# Patient Record
Sex: Female | Born: 1990 | Race: Black or African American | Hispanic: No | Marital: Single | Smoking: Never smoker
Health system: Southern US, Community
[De-identification: ages and names within clinical notes are randomized; demographics above are authoritative.]

## PROBLEM LIST (undated history)

## (undated) DIAGNOSIS — A6000 Herpesviral infection of urogenital system, unspecified: Secondary | ICD-10-CM

---
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
## 2011-05-25 ENCOUNTER — Emergency Department (HOSPITAL_COMMUNITY)
Admission: EM | Admit: 2011-05-25 | Discharge: 2011-05-25 | Disposition: A | Discharge status: Home / Self Care | Payer: No Typology Code available for payment source | Attending: Emergency Medicine | Admitting: Emergency Medicine

## 2011-05-25 ENCOUNTER — Encounter (HOSPITAL_COMMUNITY): Payer: Self-pay | Admitting: General Practice

## 2011-05-25 DIAGNOSIS — M545 Low back pain, unspecified: Secondary | ICD-10-CM | POA: Insufficient documentation

## 2011-05-25 DIAGNOSIS — X503XXA Overexertion from repetitive movements, initial encounter: Secondary | ICD-10-CM | POA: Insufficient documentation

## 2011-05-25 DIAGNOSIS — R10819 Abdominal tenderness, unspecified site: Secondary | ICD-10-CM | POA: Insufficient documentation

## 2011-05-25 DIAGNOSIS — R110 Nausea: Secondary | ICD-10-CM | POA: Insufficient documentation

## 2011-05-25 DIAGNOSIS — R109 Unspecified abdominal pain: Secondary | ICD-10-CM | POA: Insufficient documentation

## 2011-05-25 DIAGNOSIS — S39011A Strain of muscle, fascia and tendon of abdomen, initial encounter: Secondary | ICD-10-CM

## 2011-05-25 DIAGNOSIS — IMO0002 Reserved for concepts with insufficient information to code with codable children: Secondary | ICD-10-CM | POA: Insufficient documentation

## 2011-05-25 LAB — URINALYSIS, ROUTINE W REFLEX MICROSCOPIC
Bilirubin Urine: NEGATIVE
Glucose, UA: NEGATIVE mg/dL
Ketones, ur: NEGATIVE mg/dL
Leukocytes, UA: NEGATIVE
Nitrite: NEGATIVE
Protein, ur: NEGATIVE mg/dL
Specific Gravity, Urine: 1.022 (ref 1.005–1.030)
Urobilinogen, UA: 0.2 mg/dL (ref 0.0–1.0)
pH: 7.5 (ref 5.0–8.0)

## 2011-05-25 LAB — URINE MICROSCOPIC-ADD ON

## 2011-05-25 LAB — PREGNANCY, URINE: Preg Test, Ur: NEGATIVE

## 2011-05-25 MED ORDER — KETOROLAC TROMETHAMINE 30 MG/ML IJ SOLN
30.0000 mg | Freq: Once | INTRAMUSCULAR | Status: AC
  Administered 2011-05-25: 30 mg via INTRAVENOUS
  Filled 2011-05-25: qty 1

## 2011-05-25 MED ORDER — SODIUM CHLORIDE 0.9 % IV BOLUS (SEPSIS)
1000.0000 mL | Freq: Once | INTRAVENOUS | Status: AC
  Administered 2011-05-25: 1000 mL via INTRAVENOUS

## 2011-05-25 MED ORDER — HYDROCODONE-ACETAMINOPHEN 5-500 MG PO TABS: 1.0000 | ORAL_TABLET | Freq: Four times a day (QID) | ORAL | Status: AC | PRN

## 2011-05-25 MED ORDER — MORPHINE SULFATE 4 MG/ML IJ SOLN
4.0000 mg | Freq: Once | INTRAMUSCULAR | Status: AC
  Administered 2011-05-25: 4 mg via INTRAVENOUS
  Filled 2011-05-25: qty 1

## 2011-05-25 MED ORDER — NAPROXEN 500 MG PO TABS: 500.0000 mg | ORAL_TABLET | Freq: Two times a day (BID) | ORAL | Status: DC

## 2011-05-25 MED ORDER — ONDANSETRON HCL 4 MG/2ML IJ SOLN
4.0000 mg | Freq: Once | INTRAMUSCULAR | Status: AC
Start: 2011-05-25 — End: 2011-05-25
  Administered 2011-05-25: 4 mg via INTRAVENOUS
  Filled 2011-05-25: qty 2

## 2011-05-25 NOTE — ED Notes (Signed)
Patient states that she was doing some strength training exercises yesterday that focused on her side. Later that evening, the patient states she felt pain in her left flank area and that she felt slightly nauseated. Patient reports loose stools x 2 since last night and states that the pain has gotten worse. She spoke to her primary care physician and they recommended she come to the ER.

## 2011-05-25 NOTE — ED Notes (Signed)
Pt is alert and oriented x 4 with respirations even and unlabored.  NAD at this time.  Discharge instructions reviewed with patient and patient verbalized understanding.  Pt ambulated to lobby with steady gait and friend to transport patient home.

## 2011-05-25 NOTE — ED Provider Notes (Signed)
History     CSN: [id number]  Arrival date & time 05/25/11  1214   First MD Initiated Contact with Patient 05/25/11 1311      Chief Complaint  Patient presents with  . Flank Pain    (Consider location/radiation/quality/duration/timing/severity/associated sxs/prior treatment) HPI Comments: Patient states she did a lot of exercises to her oblique muscles yesterday. Awoke today with left-sided abdominal and flank pain. No urinary symptoms  Patient is a 21 y.o. female presenting with flank pain. The history is provided by the patient. No language interpreter was used.  Flank Pain This is a new problem. The current episode started yesterday. The problem occurs constantly. The problem has been gradually worsening. Associated symptoms include abdominal pain. Pertinent negatives include no chest pain, no headaches and no shortness of breath. The symptoms are aggravated by bending and twisting. The symptoms are relieved by nothing. She has tried acetaminophen for the symptoms. The treatment provided no relief.    History reviewed. No pertinent past medical history.  Past Surgical History  Procedure Date  . Tonsillectomy November 23, 2010    No family history on file.  History  Substance Use Topics  . Smoking status: Never Smoker   . Smokeless tobacco: Not on file  . Alcohol Use: Yes     ocassionally    OB History    Grav Para Term Preterm Abortions TAB SAB Ect Mult Living                  Review of Systems  Constitutional: Negative for fever, activity change, appetite change and fatigue.  HENT: Negative for congestion, sore throat, rhinorrhea, neck pain and neck stiffness.   Respiratory: Negative for cough and shortness of breath.   Cardiovascular: Negative for chest pain and palpitations.  Gastrointestinal: Positive for nausea and abdominal pain. Negative for vomiting, diarrhea and constipation.  Genitourinary: Positive for flank pain. Negative for dysuria, urgency and  frequency.  Musculoskeletal: Positive for back pain. Negative for myalgias and arthralgias.  Neurological: Negative for dizziness, weakness, light-headedness, numbness and headaches.  All other systems reviewed and are negative.    Allergies  Review of patient's allergies indicates no known allergies.  Home Medications   Current Outpatient Rx  Name Route Sig Dispense Refill  . ACETAMINOPHEN 500 MG PO TABS Oral Take 1,000 mg by mouth every 6 (six) hours as needed. Pain.    . ACYCLOVIR 400 MG PO TABS Oral Take 400 mg by mouth 3 (three) times daily. For 3 or 4 days as needed for outbreak.    . BC HEADACHE POWDER PO Oral Take 1 packet by mouth daily as needed. For pain    . DESONIDE 0.05 % EX CREA Topical Apply 1 application topically. Apply to affected area 2 to 3 times daily.    Marland Kitchen HYDROCODONE-ACETAMINOPHEN 5-500 MG PO TABS Oral Take 1-2 tablets by mouth every 6 (six) hours as needed for pain. 10 tablet 0  . NAPROXEN 500 MG PO TABS Oral Take 1 tablet (500 mg total) by mouth 2 (two) times daily. 30 tablet 0    BP 106/63  Pulse 63  Temp(Src) 98.5 F (36.9 C) (Oral)  Resp 15  SpO2 100%  LMP 05/15/2011  Physical Exam  Nursing note and vitals reviewed. Constitutional: She is oriented to person, place, and time. She appears well-developed and well-nourished. No distress.  HENT:  Head: Normocephalic and atraumatic.  Mouth/Throat: Oropharynx is clear and moist. No oropharyngeal exudate.  Eyes: Conjunctivae and EOM are normal.  Pupils are equal, round, and reactive to light.  Neck: Normal range of motion. Neck supple.  Cardiovascular: Normal rate, regular rhythm and intact distal pulses.  Exam reveals no gallop and no friction rub.   No murmur heard. Pulmonary/Chest: Effort normal and breath sounds normal. No respiratory distress. She exhibits no tenderness.  Abdominal: Soft. Bowel sounds are normal. There is tenderness (L sided abdominal pain and flank pain).       No CVA tenderness    Musculoskeletal: Normal range of motion.       Muscular pain on palpation of the L flank and paraspinal musculature of low back  Neurological: She is alert and oriented to person, place, and time.  Skin: Skin is warm and dry. No rash noted.    ED Course  Procedures (including critical care time)  Labs Reviewed  URINALYSIS, ROUTINE W REFLEX MICROSCOPIC - Abnormal; Notable for the following:    Hgb urine dipstick TRACE (*)    All other components within normal limits  URINE MICROSCOPIC-ADD ON - Abnormal; Notable for the following:    Squamous Epithelial / LPF FEW (*)    Bacteria, UA FEW (*)    All other components within normal limits  PREGNANCY, URINE   No results found.   1. Abdominal muscle strain       MDM  No evidence of urinary tract infection. There is no hematuria therefore have little concern about a kidney stone. Patient's pain is only with movement. I feel this is consistent with a muscle strain given her recent excess exercise. I have no concern about a pelvic etiology of her pain. Her pain is all above this location. Her pain was controlled one emergency department. She'll be discharged home with pain vacation in an NSAID. She was encouraged to apply ice for the first 2 days and heat thereafter.        Dayton Bailiff, MD 05/25/11 1459

## 2011-12-29 ENCOUNTER — Encounter (HOSPITAL_COMMUNITY): Payer: Self-pay | Admitting: *Deleted

## 2011-12-29 ENCOUNTER — Emergency Department (HOSPITAL_COMMUNITY)
Admission: EM | Admit: 2011-12-29 | Discharge: 2011-12-29 | Disposition: A | Discharge status: Home / Self Care | Payer: No Typology Code available for payment source | Attending: Emergency Medicine | Admitting: Emergency Medicine

## 2011-12-29 DIAGNOSIS — B9689 Other specified bacterial agents as the cause of diseases classified elsewhere: Secondary | ICD-10-CM | POA: Insufficient documentation

## 2011-12-29 DIAGNOSIS — N760 Acute vaginitis: Secondary | ICD-10-CM | POA: Insufficient documentation

## 2011-12-29 DIAGNOSIS — A499 Bacterial infection, unspecified: Secondary | ICD-10-CM | POA: Insufficient documentation

## 2011-12-29 DIAGNOSIS — N898 Other specified noninflammatory disorders of vagina: Secondary | ICD-10-CM

## 2011-12-29 DIAGNOSIS — N390 Urinary tract infection, site not specified: Secondary | ICD-10-CM | POA: Insufficient documentation

## 2011-12-29 LAB — URINALYSIS, ROUTINE W REFLEX MICROSCOPIC
Bilirubin Urine: NEGATIVE
Glucose, UA: NEGATIVE mg/dL
Ketones, ur: 15 mg/dL — AB
Nitrite: NEGATIVE
Protein, ur: NEGATIVE mg/dL
Specific Gravity, Urine: 1.03 (ref 1.005–1.030)
Urobilinogen, UA: 1 mg/dL (ref 0.0–1.0)
pH: 6 (ref 5.0–8.0)

## 2011-12-29 LAB — CBC WITH DIFFERENTIAL/PLATELET
Basophils Absolute: 0 10*3/uL (ref 0.0–0.1)
Basophils Relative: 0 % (ref 0–1)
Eosinophils Absolute: 0.2 10*3/uL (ref 0.0–0.7)
Eosinophils Relative: 3 % (ref 0–5)
HCT: 39.1 % (ref 36.0–46.0)
Hemoglobin: 13.2 g/dL (ref 12.0–15.0)
Lymphocytes Relative: 33 % (ref 12–46)
Lymphs Abs: 3.2 10*3/uL (ref 0.7–4.0)
MCH: 29.7 pg (ref 26.0–34.0)
MCHC: 33.8 g/dL (ref 30.0–36.0)
MCV: 88.1 fL (ref 78.0–100.0)
Monocytes Absolute: 0.7 10*3/uL (ref 0.1–1.0)
Monocytes Relative: 7 % (ref 3–12)
Neutro Abs: 5.6 10*3/uL (ref 1.7–7.7)
Neutrophils Relative %: 57 % (ref 43–77)
Platelets: 327 10*3/uL (ref 150–400)
RBC: 4.44 MIL/uL (ref 3.87–5.11)
RDW: 12.8 % (ref 11.5–15.5)
WBC: 9.7 10*3/uL (ref 4.0–10.5)

## 2011-12-29 LAB — WET PREP, GENITAL
Trich, Wet Prep: NONE SEEN
Yeast Wet Prep HPF POC: NONE SEEN

## 2011-12-29 LAB — COMPREHENSIVE METABOLIC PANEL
ALT: 14 U/L (ref 0–35)
AST: 21 U/L (ref 0–37)
Albumin: 4.5 g/dL (ref 3.5–5.2)
Alkaline Phosphatase: 110 U/L (ref 39–117)
BUN: 7 mg/dL (ref 6–23)
CO2: 24 mEq/L (ref 19–32)
Calcium: 9.5 mg/dL (ref 8.4–10.5)
Chloride: 102 mEq/L (ref 96–112)
Creatinine, Ser: 0.55 mg/dL (ref 0.50–1.10)
GFR calc Af Amer: >90 mL/min (ref >90)
GFR calc non Af Amer: >90 mL/min (ref >90)
Glucose, Bld: 78 mg/dL (ref 70–99)
Potassium: 3.5 mEq/L (ref 3.5–5.1)
Sodium: 138 mEq/L (ref 135–145)
Total Bilirubin: 0.7 mg/dL (ref 0.3–1.2)
Total Protein: 7.8 g/dL (ref 6.0–8.3)

## 2011-12-29 LAB — URINE MICROSCOPIC-ADD ON

## 2011-12-29 LAB — PREGNANCY, URINE: Preg Test, Ur: NEGATIVE

## 2011-12-29 MED ORDER — CEPHALEXIN 500 MG PO CAPS: 500.0000 mg | ORAL_CAPSULE | Freq: Four times a day (QID) | ORAL | Status: AC

## 2011-12-29 MED ORDER — FLUCONAZOLE 150 MG PO TABS
150.0000 mg | ORAL_TABLET | Freq: Once | ORAL | Status: AC
  Administered 2011-12-29: 150 mg via ORAL
  Filled 2011-12-29: qty 1

## 2011-12-29 MED ORDER — METRONIDAZOLE 500 MG PO TABS
2000.0000 mg | ORAL_TABLET | Freq: Once | ORAL | Status: AC
  Administered 2011-12-29: 2000 mg via ORAL
  Filled 2011-12-29: qty 4

## 2011-12-29 NOTE — ED Notes (Signed)
Pt reports "milky" white vaginal discharge without odor.  Pt also reports itching.  Pt states that her and her boyfriend "just started having sex and he thinks it came from him."  Pt reports having unprotected sex.  Pt states "i know it's not STD."

## 2011-12-29 NOTE — ED Provider Notes (Signed)
History     CSN: [id number]  Arrival date & time 12/29/11  1419   First MD Initiated Contact with Patient 12/29/11 1932      Chief Complaint  Patient presents with  . Vaginal Discharge    (Consider location/radiation/quality/duration/timing/severity/associated sxs/prior treatment) HPI Comments: Patient presents with 2 days of vaginal discharge as milk white. Denies odor, is itchy but not painful. No abdominal pain, nausea, vomiting or fevers. No urinary symptoms. No flank pain. Sexually active with boyfriend and uses condoms occasionally.  The history is provided by the patient.    History reviewed. No pertinent past medical history.  Past Surgical History  Procedure Date  . Tonsillectomy November 23, 2010    No family history on file.  History  Substance Use Topics  . Smoking status: Never Smoker   . Smokeless tobacco: Not on file  . Alcohol Use: Yes     ocassionally    OB History    Grav Para Term Preterm Abortions TAB SAB Ect Mult Living                  Review of Systems  Constitutional: Negative for fever, activity change and appetite change.  HENT: Negative for congestion and rhinorrhea.   Respiratory: Negative for cough and chest tightness.   Cardiovascular: Negative for chest pain.  Gastrointestinal: Negative for nausea, vomiting and abdominal pain.  Genitourinary: Positive for vaginal discharge. Negative for flank pain.  Musculoskeletal: Negative for back pain.  Skin: Negative for rash.  Neurological: Negative for dizziness and headaches.    Allergies  Review of patient's allergies indicates no known allergies.  Home Medications   Current Outpatient Rx  Name Route Sig Dispense Refill  . ACYCLOVIR 400 MG PO TABS Oral Take 400 mg by mouth 3 (three) times daily as needed. For 3 or 4 days as needed for outbreak.    . CEPHALEXIN 500 MG PO CAPS Oral Take 1 capsule (500 mg total) by mouth 4 (four) times daily. 40 capsule 0    BP 122/68  Pulse 67  Temp  97.3 F (36.3 C) (Axillary)  Resp 18  Wt 150 lb (68.04 kg)  SpO2 100%  LMP 12/15/2011  Physical Exam  Constitutional: She is oriented to person, place, and time. She appears well-developed and well-nourished. No distress.  HENT:  Head: Normocephalic and atraumatic.  Mouth/Throat: Oropharynx is clear and moist. No oropharyngeal exudate.  Eyes: Conjunctivae and EOM are normal. Pupils are equal, round, and reactive to light.  Neck: Normal range of motion. Neck supple.  Cardiovascular: Normal rate, regular rhythm and normal heart sounds.   Pulmonary/Chest: Effort normal and breath sounds normal. No respiratory distress.  Abdominal: Soft. There is no tenderness. There is no rebound and no guarding.  Genitourinary: Cervix exhibits discharge. Cervix exhibits no motion tenderness. Right adnexum displays no mass and no tenderness. Left adnexum displays no mass and no tenderness. Vaginal discharge found.  Musculoskeletal: Normal range of motion. She exhibits no edema and no tenderness.  Neurological: She is alert and oriented to person, place, and time. No cranial nerve deficit.  Skin: Skin is warm.    ED Course  Procedures (including critical care time)  Labs Reviewed  URINALYSIS, ROUTINE W REFLEX MICROSCOPIC - Abnormal; Notable for the following:    APPearance CLOUDY (*)     Hgb urine dipstick SMALL (*)     Ketones, ur 15 (*)     Leukocytes, UA MODERATE (*)     All other components within  normal limits  WET PREP, GENITAL - Abnormal; Notable for the following:    Clue Cells Wet Prep HPF POC FEW (*)     WBC, Wet Prep HPF POC RARE (*)     All other components within normal limits  URINE MICROSCOPIC-ADD ON - Abnormal; Notable for the following:    Squamous Epithelial / LPF MANY (*)     Bacteria, UA FEW (*)     All other components within normal limits  PREGNANCY, URINE  CBC WITH DIFFERENTIAL  COMPREHENSIVE METABOLIC PANEL  GC/CHLAMYDIA PROBE AMP, GENITAL   No results found.   1.  Vaginal Discharge   2. Urinary tract infection   3. Bacterial vaginosis       MDM  Vaginal discharge consistent with yeast. Abdomen soft, no fever, no CVA pain  Treat bacterial vaginosis and the urinary tract infection.  Patient given empiric Diflucan for presumed candida as well. Abdomen soft and nontender.    Glynn Octave, MD 12/29/11 [phone]

## 2011-12-29 NOTE — ED Notes (Signed)
Lab at bedside

## 2011-12-30 LAB — GC/CHLAMYDIA PROBE AMP, GENITAL
Chlamydia, DNA Probe: NEGATIVE
GC Probe Amp, Genital: NEGATIVE

## 2012-02-03 ENCOUNTER — Encounter (HOSPITAL_COMMUNITY): Payer: Self-pay | Admitting: *Deleted

## 2012-02-03 ENCOUNTER — Emergency Department (HOSPITAL_COMMUNITY)
Admission: EM | Admit: 2012-02-03 | Discharge: 2012-02-03 | Disposition: A | Discharge status: Home / Self Care | Payer: PRIVATE HEALTH INSURANCE | Attending: Emergency Medicine | Admitting: Emergency Medicine

## 2012-02-03 ENCOUNTER — Emergency Department (HOSPITAL_COMMUNITY): Payer: PRIVATE HEALTH INSURANCE

## 2012-02-03 DIAGNOSIS — R102 Pelvic and perineal pain: Secondary | ICD-10-CM

## 2012-02-03 DIAGNOSIS — N898 Other specified noninflammatory disorders of vagina: Secondary | ICD-10-CM | POA: Insufficient documentation

## 2012-02-03 HISTORY — DX: Herpesviral infection of urogenital system, unspecified: A60.00

## 2012-02-03 LAB — URINALYSIS, ROUTINE W REFLEX MICROSCOPIC
Bilirubin Urine: NEGATIVE
Glucose, UA: NEGATIVE mg/dL
Hgb urine dipstick: NEGATIVE
Ketones, ur: NEGATIVE mg/dL
Leukocytes, UA: NEGATIVE
Nitrite: NEGATIVE
Protein, ur: NEGATIVE mg/dL
Specific Gravity, Urine: 1.029 (ref 1.005–1.030)
Urobilinogen, UA: 1 mg/dL (ref 0.0–1.0)
pH: 6.5 (ref 5.0–8.0)

## 2012-02-03 LAB — WET PREP, GENITAL
Trich, Wet Prep: NONE SEEN
Yeast Wet Prep HPF POC: NONE SEEN

## 2012-02-03 LAB — PREGNANCY, URINE: Preg Test, Ur: NEGATIVE

## 2012-02-03 MED ORDER — NAPROXEN 250 MG PO TABS: 250.0000 mg | ORAL_TABLET | Freq: Two times a day (BID) | ORAL | Status: DC

## 2012-02-03 NOTE — ED Provider Notes (Signed)
History     CSN: [id number]  Arrival date & time 02/03/12  7829   First MD Initiated Contact with Patient 02/03/12 0802      Chief Complaint  Patient presents with  . Abdominal Pain     HPI Pt was seen at 0830.  Per pt, c/o gradual onset and persistence of constant vaginal discharge for the past 1 week.  Pt states she had the same symptoms last month, was eval in the ED, dx bacterial vaginosis, rx antibiotics.  Pt states she initially felt improved, but symptoms returned.  Pt did not f/u with her PMD or OB/GYN.  Pt now c/o vaginal discharge with associated nausea and lower pelvic "pain."  Denies vaginal bleeding, no back/flank pain, no fevers, no N/V/D, no dysuria, no abd pain, no rash.     Past Medical History  Diagnosis Date  . Genital herpes     Past Surgical History  Procedure Date  . Tonsillectomy November 23, 2010    History  Substance Use Topics  . Smoking status: Never Smoker   . Smokeless tobacco: Not on file  . Alcohol Use: Yes     ocassionally      Review of Systems ROS: Statement: All systems negative except as marked or noted in the HPI; Constitutional: Negative for fever and chills. ; ; Eyes: Negative for eye pain, redness and discharge. ; ; ENMT: Negative for ear pain, hoarseness, nasal congestion, sinus pressure and sore throat. ; ; Cardiovascular: Negative for chest pain, palpitations, diaphoresis, dyspnea and peripheral edema. ; ; Respiratory: Negative for cough, wheezing and stridor. ; ; Gastrointestinal: +nausea. Negative for vomiting, diarrhea, abdominal pain, blood in stool, hematemesis, jaundice and rectal bleeding. . ; ; Genitourinary: Negative for dysuria, flank pain and hematuria. ; ; GYN:  No vaginal bleeding,+vaginal discharge, pelvic pain. No vulvar pain. ;; Musculoskeletal: Negative for back pain and neck pain. Negative for swelling and trauma.; ; Skin: Negative for pruritus, rash, abrasions, blisters, bruising and skin lesion.; ; Neuro: Negative  for headache, lightheadedness and neck stiffness. Negative for weakness, altered level of consciousness , altered mental status, extremity weakness, paresthesias, involuntary movement, seizure and syncope.       Allergies  Review of patient's allergies indicates no known allergies.  Home Medications   Current Outpatient Rx  Name Route Sig Dispense Refill  . ACYCLOVIR 400 MG PO TABS Oral Take 400 mg by mouth 3 (three) times daily as needed. For 3 or 4 days as needed for outbreak.      BP 124/73  Pulse 110  Temp 98.8 F (37.1 C) (Oral)  Resp 20  Ht 5\' 3"  (1.6 m)  Wt 150 lb (68.04 kg)  BMI 26.57 kg/m2  SpO2 98%  Physical Exam 0835: Physical examination:  Nursing notes reviewed; Vital signs and O2 SAT reviewed;  Constitutional: Well developed, Well nourished, Well hydrated, In no acute distress; Head:  Normocephalic, atraumatic; Eyes: EOMI, PERRL, No scleral icterus; ENMT: Mouth and pharynx normal, Mucous membranes moist; Neck: Supple, Full range of motion, No lymphadenopathy; Cardiovascular: Regular rate and rhythm, No murmur, rub, or gallop; Respiratory: Breath sounds clear & equal bilaterally, No rales, rhonchi, wheezes.  Speaking full sentences with ease, Normal respiratory effort/excursion; Chest: Nontender, Movement normal; Abdomen: Soft, Nontender, Nondistended, Normal bowel sounds; Genitourinary: No CVA tenderness; Pelvic exam performed with permission of pt and female ED tech assist during exam.  External genitalia w/o lesions. Vaginal vault with small amount of thick white discharge.  Cervix w/o lesions, not  friable, GC/chlam and wet prep obtained and sent to lab.  Bimanual exam w/o CMT, uterine or adnexal tenderness.  Extremities: Pulses normal, No tenderness, No edema, No calf edema or asymmetry.; Neuro: AA&Ox3, Major CN grossly intact.  Speech clear. No gross focal motor or sensory deficits in extremities.; Skin: Color normal, Warm, Dry.   ED Course  Procedures    MDM    MDM Reviewed: previous chart, nursing note and vitals Reviewed previous: labs Interpretation: labs and ultrasound   Results for orders placed during the hospital encounter of 02/03/12  URINALYSIS, ROUTINE W REFLEX MICROSCOPIC      Component Value Range   Color, Urine YELLOW  YELLOW   APPearance CLOUDY (*) CLEAR   Specific Gravity, Urine 1.029  1.005 - 1.030   pH 6.5  5.0 - 8.0   Glucose, UA NEGATIVE  NEGATIVE mg/dL   Hgb urine dipstick NEGATIVE  NEGATIVE   Bilirubin Urine NEGATIVE  NEGATIVE   Ketones, ur NEGATIVE  NEGATIVE mg/dL   Protein, ur NEGATIVE  NEGATIVE mg/dL   Urobilinogen, UA 1.0  0.0 - 1.0 mg/dL   Nitrite NEGATIVE  NEGATIVE   Leukocytes, UA NEGATIVE  NEGATIVE  PREGNANCY, URINE      Component Value Range   Preg Test, Ur NEGATIVE  NEGATIVE  WET PREP, GENITAL      Component Value Range   Yeast Wet Prep HPF POC NONE SEEN  NONE SEEN   Trich, Wet Prep NONE SEEN  NONE SEEN   Clue Cells Wet Prep HPF POC NONE SEEN  NONE SEEN   WBC, Wet Prep HPF POC FEW (*) NONE SEEN    US Pelvis Complete 02/03/2012  *RADIOLOGY REPORT*  Clinical Data:  Pain, suspect possible torsion or abscess  TRANSABDOMINAL AND TRANSVAGINAL ULTRASOUND OF PELVIS DOPPLER ULTRASOUND OF OVARIES  Technique:  Both transabdominal and transvaginal ultrasound examinations of the pelvis were performed. Transabdominal technique was performed for global imaging of the pelvis including uterus, ovaries, adnexal regions, and pelvic cul-de-sac.  It was necessary to proceed with endovaginal exam following the transabdominal exam to visualize the endometrium and ovaries.  Color and duplex Doppler ultrasound was utilized to evaluate blood flow to the ovaries.  Comparison:  None.  Findings:  Uterus:  The uterus is retroverted and retroflexed.  It measures 80 x 39 x 39 mm.  The endometrium appears normal at 9 mm.  Endometrium:  Normal in thickness and appearance  Right ovary: 36 x 16 x 22 mm with normal blood flow and normal  appearance.  Left ovary:   40 x 24 x 33 mm with normal blood flow.  There is a 25 x 12 x 24 mm simple cyst.  Pulsed Doppler evaluation demonstrates normal low-resistance arterial and venous waveforms in both ovaries.  There is a small volume of free fluid in the cul-de-sac. Incidental note is made of Nabothian cysts in the cervix.  IMPRESSION: Simple cyst left ovary with no evidence of abscess or portion. Physiologic volume of free fluid in the pelvis.   Original Report Authenticated By: Otilio Carpen, M.D.      1125:  No UTI on Udip.  GC/chlam pending.  No acute findings on pelvic US.  Encouraged to f/u with her OB/GYN for good continuity of care. Dx testing d/w pt.  Questions answered.  Verb understanding, agreeable to d/c home with outpt f/u.          Laray Anger, DO 02/06/12 2040

## 2012-02-03 NOTE — ED Notes (Signed)
Patient transported to Ultrasound 

## 2012-02-03 NOTE — ED Notes (Signed)
Pt return from US  

## 2012-02-03 NOTE — ED Notes (Signed)
Patient reports she was seen at [facility],  She was treated for bacterial vaginosis. Patient states her sx seemed to go away but then her sx returned.  Patient used otc med to control itching.  Patient states she is now having abd pain, nausea.  Patient had period last month,  She states it was heavy and late.  She has not taken a pregnancy test.  Patient denies pain when voiding.  She states she some discharge

## 2012-02-05 LAB — GC/CHLAMYDIA PROBE AMP, GENITAL
Chlamydia, DNA Probe: NEGATIVE
GC Probe Amp, Genital: NEGATIVE

## 2012-12-13 ENCOUNTER — Emergency Department (HOSPITAL_COMMUNITY)
Admission: EM | Admit: 2012-12-13 | Discharge: 2012-12-13 | Disposition: A | Discharge status: Home / Self Care | Payer: No Typology Code available for payment source | Attending: Emergency Medicine | Admitting: Emergency Medicine

## 2012-12-13 ENCOUNTER — Encounter (HOSPITAL_COMMUNITY): Payer: Self-pay | Admitting: Emergency Medicine

## 2012-12-13 ENCOUNTER — Emergency Department (HOSPITAL_COMMUNITY): Payer: No Typology Code available for payment source

## 2012-12-13 DIAGNOSIS — S00209A Unspecified superficial injury of unspecified eyelid and periocular area, initial encounter: Secondary | ICD-10-CM | POA: Insufficient documentation

## 2012-12-13 DIAGNOSIS — R404 Transient alteration of awareness: Secondary | ICD-10-CM | POA: Insufficient documentation

## 2012-12-13 DIAGNOSIS — R51 Headache: Secondary | ICD-10-CM | POA: Insufficient documentation

## 2012-12-13 DIAGNOSIS — R296 Repeated falls: Secondary | ICD-10-CM | POA: Insufficient documentation

## 2012-12-13 DIAGNOSIS — IMO0002 Reserved for concepts with insufficient information to code with codable children: Secondary | ICD-10-CM | POA: Insufficient documentation

## 2012-12-13 DIAGNOSIS — S025XXA Fracture of tooth (traumatic), initial encounter for closed fracture: Secondary | ICD-10-CM | POA: Insufficient documentation

## 2012-12-13 DIAGNOSIS — Z8619 Personal history of other infectious and parasitic diseases: Secondary | ICD-10-CM | POA: Insufficient documentation

## 2012-12-13 DIAGNOSIS — Y9389 Activity, other specified: Secondary | ICD-10-CM | POA: Insufficient documentation

## 2012-12-13 DIAGNOSIS — Y9229 Other specified public building as the place of occurrence of the external cause: Secondary | ICD-10-CM | POA: Insufficient documentation

## 2012-12-13 MED ORDER — HYDROCODONE-ACETAMINOPHEN 5-325 MG PO TABS: 1.0000 | ORAL_TABLET | ORAL | Status: AC | PRN

## 2012-12-13 MED ORDER — HYDROCODONE-ACETAMINOPHEN 5-325 MG PO TABS
1.0000 | ORAL_TABLET | Freq: Once | ORAL | Status: AC
  Administered 2012-12-13: 1 via ORAL
  Filled 2012-12-13: qty 1

## 2012-12-13 NOTE — ED Notes (Signed)
Pt back from ct with transport
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
## 2012-12-13 NOTE — ED Provider Notes (Signed)
Medical screening examination/treatment/procedure(s) were performed by non-physician practitioner and as supervising physician I was immediately available for consultation/collaboration.   Gavin Pound. Oletta Lamas, MD 12/13/12 [phone]
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
## 2012-12-13 NOTE — ED Notes (Signed)
[employer] remain at bedside 

## 2012-12-13 NOTE — ED Notes (Signed)
Pt given ice pack

## 2012-12-13 NOTE — ED Notes (Signed)
Pt taken to ct scan with transport

## 2013-01-30 IMAGING — US US PELVIS COMPLETE
1 series · 13 of 25 positions shown · non-contrast
Comparison: None.

CLINICAL DATA: Pain, suspect possible torsion or abscess

TRANSABDOMINAL AND TRANSVAGINAL ULTRASOUND OF PELVIS
DOPPLER ULTRASOUND OF OVARIES
TECHNIQUE: Both transabdominal and transvaginal ultrasound
examinations of the pelvis were performed. Transabdominal technique
was performed for global imaging of the pelvis including uterus,
ovaries, adnexal regions, and pelvic cul-de-sac.
It was necessary to proceed with endovaginal exam following the
transabdominal exam to visualize the endometrium and ovaries.
Color and duplex Doppler ultrasound was utilized to evaluate blood
flow to the ovaries.

[Series 1: us pelvis complete · 0.25mm/px · 13 of 56 slices shown]
[im 1/56]
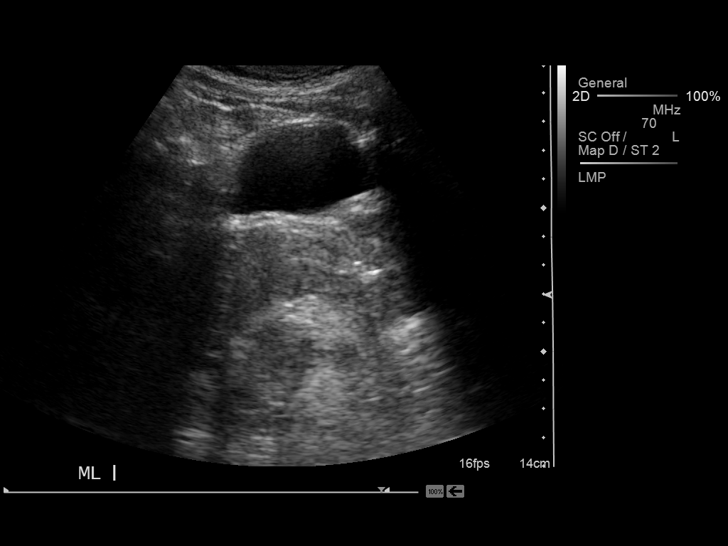
[im 5/56]
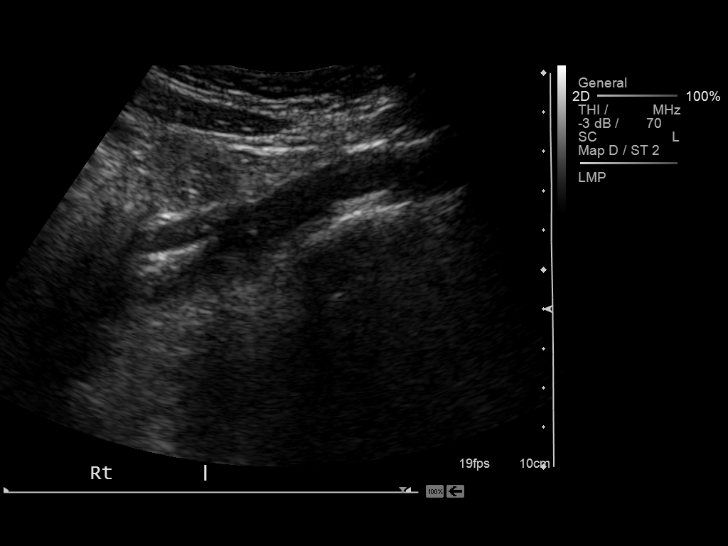
[im 10/56]
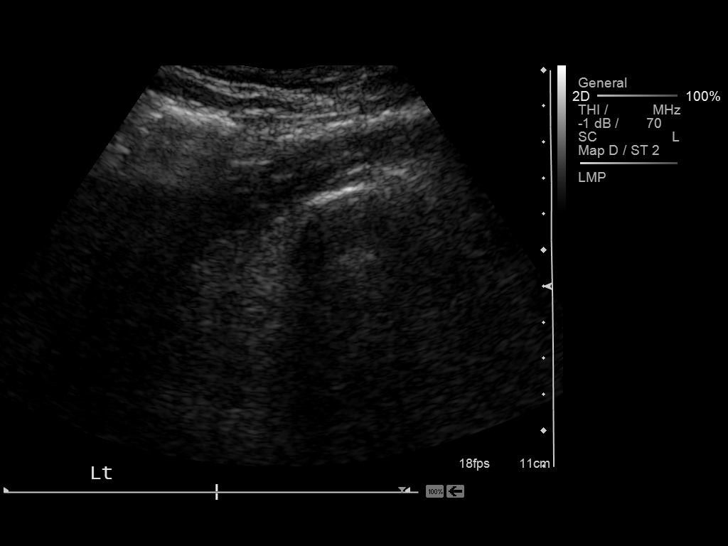
[im 14/56]
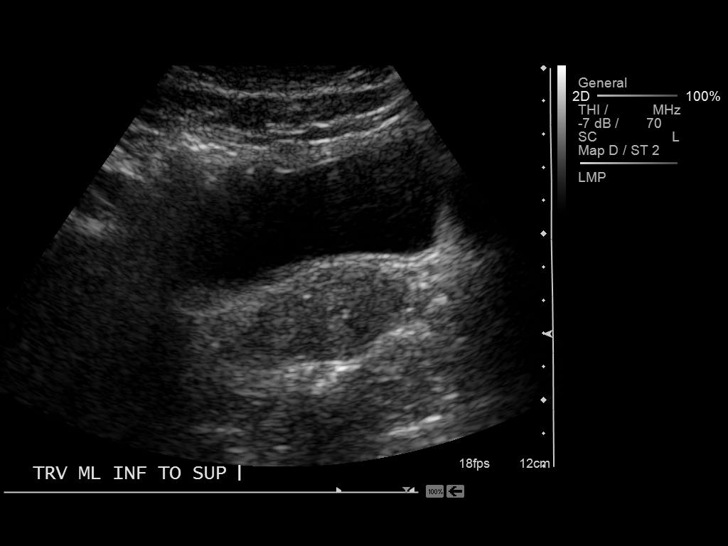
[im 19/56]
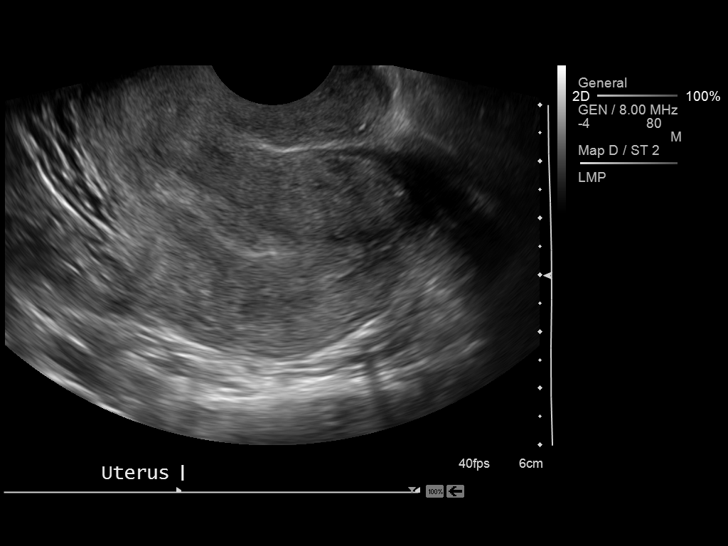
[im 23/56]
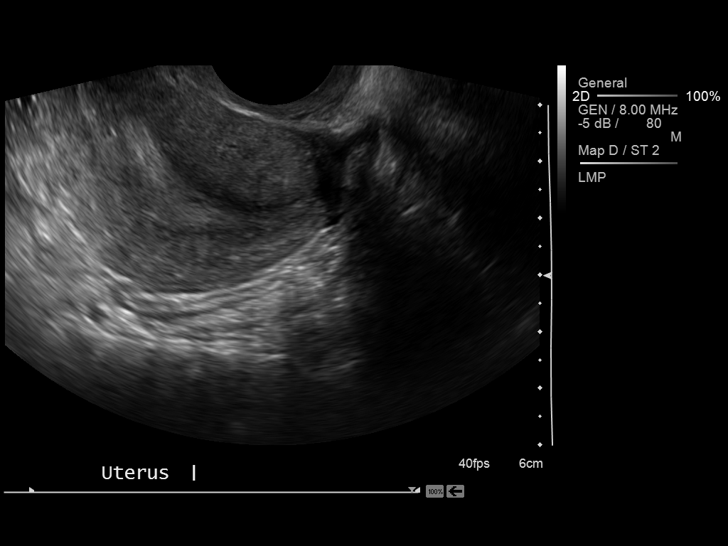
[im 28/56]
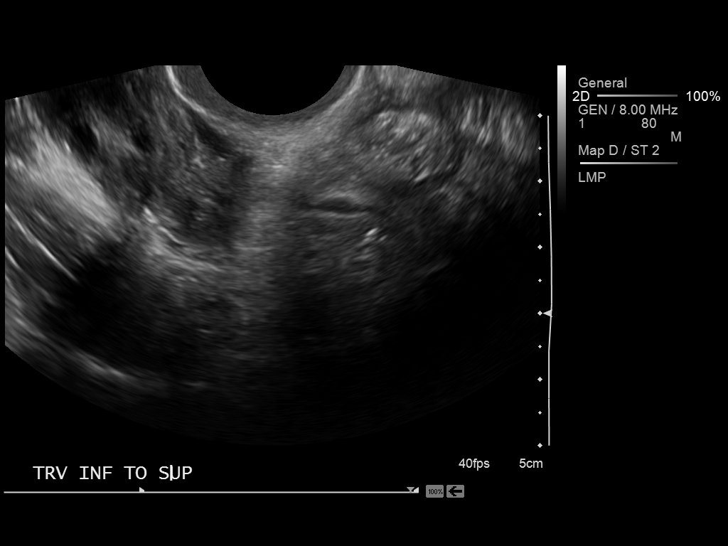
[im 33/56]
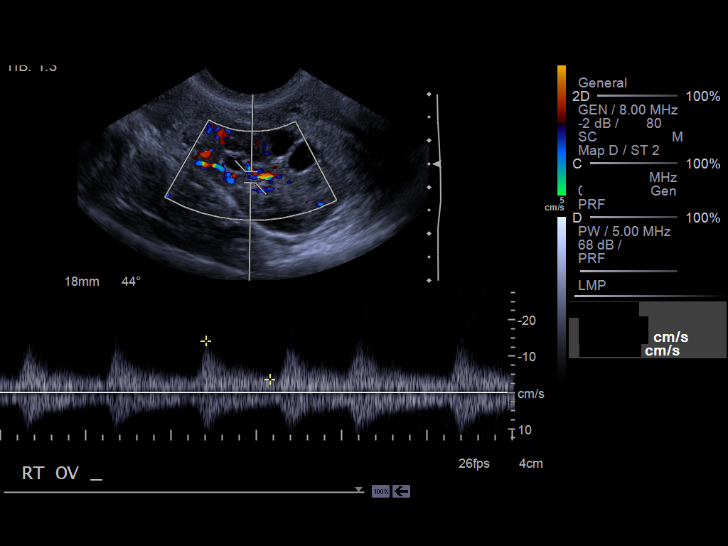
[im 37/56]
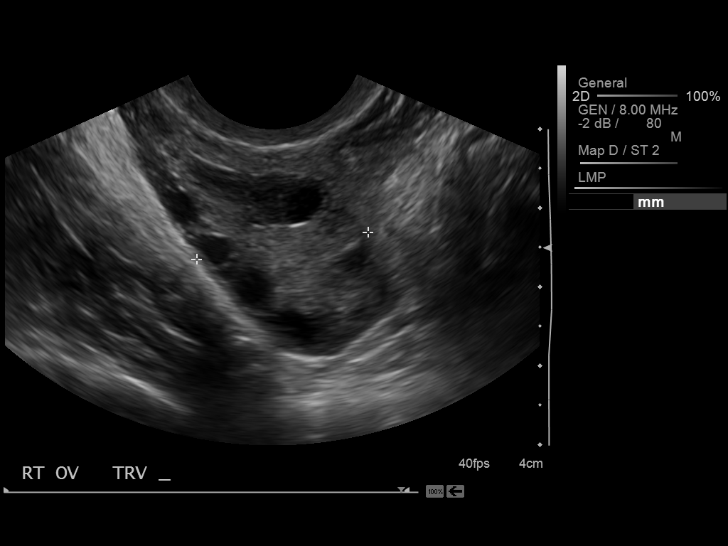
[im 42/56]
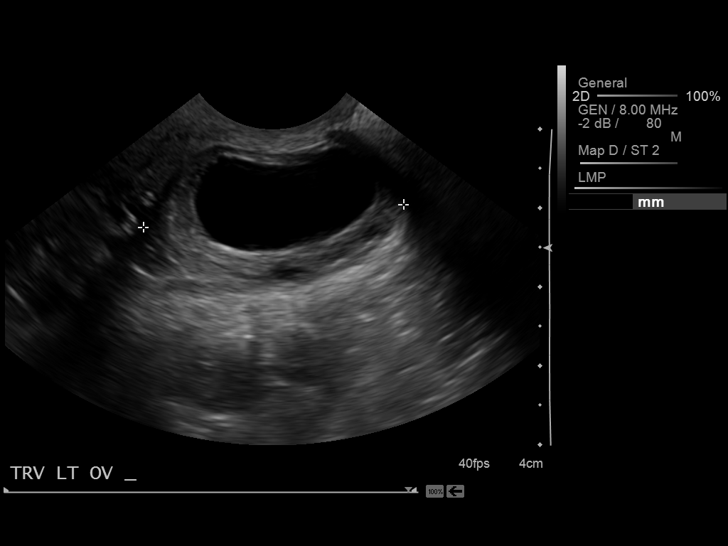
[im 46/56]
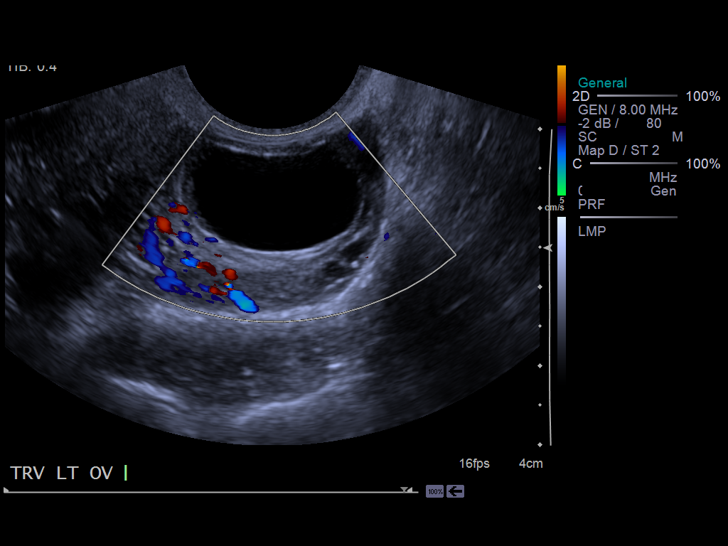
[im 51/56]
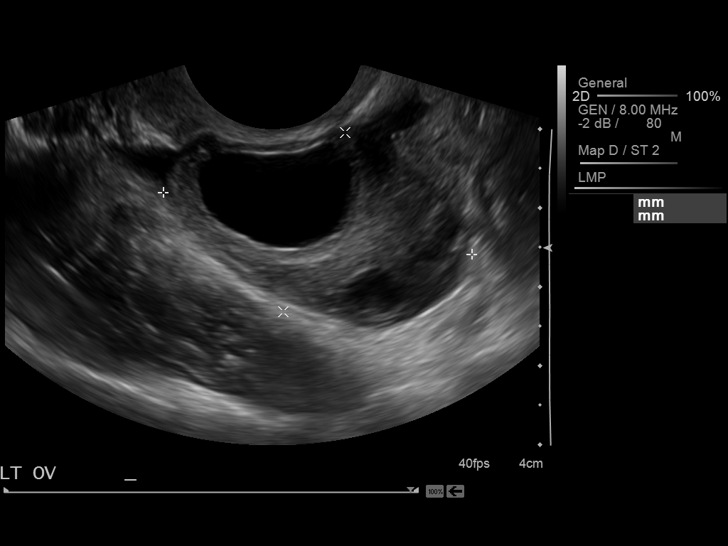
[im 56/56]
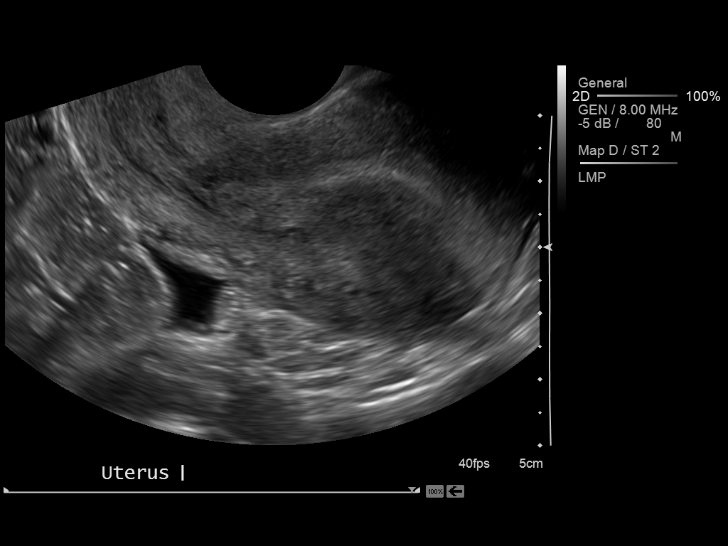

[13 of 25 positions shown; findings below may reference images not displayed]

FINDINGS: Uterus:  The uterus is retroverted and retroflexed.  It measures 80
x 39 x 39 mm.  The endometrium appears normal at 9 mm.

Endometrium:  Normal in thickness and appearance

Right ovary: 36 x 16 x 22 mm with normal blood flow and normal
appearance.

Left ovary:   40 x 24 x 33 mm with normal blood flow.  There is a
25 x 12 x 24 mm simple cyst.

Pulsed Doppler evaluation demonstrates normal low-resistance
arterial and venous waveforms in both ovaries.

There is a small volume of free fluid in the cul-de-sac.
Incidental note is made of Nabothian cysts in the cervix.
IMPRESSION: Simple cyst left ovary with no evidence of abscess or portion.
Physiologic volume of free fluid in the pelvis.
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
## 2014-10-31 ENCOUNTER — Encounter (HOSPITAL_COMMUNITY): Payer: Self-pay | Admitting: Emergency Medicine

## 2014-10-31 ENCOUNTER — Emergency Department (HOSPITAL_COMMUNITY)
Admission: EM | Admit: 2014-10-31 | Discharge: 2014-10-31 | Disposition: A | Discharge status: Home / Self Care | Payer: PRIVATE HEALTH INSURANCE | Attending: Emergency Medicine | Admitting: Emergency Medicine

## 2014-10-31 ENCOUNTER — Ambulatory Visit: Payer: Self-pay

## 2014-10-31 DIAGNOSIS — Z202 Contact with and (suspected) exposure to infections with a predominantly sexual mode of transmission: Secondary | ICD-10-CM | POA: Diagnosis present

## 2014-10-31 DIAGNOSIS — Z8619 Personal history of other infectious and parasitic diseases: Secondary | ICD-10-CM | POA: Insufficient documentation

## 2014-10-31 MED ORDER — CEFTRIAXONE SODIUM 250 MG IJ SOLR
250.0000 mg | Freq: Once | INTRAMUSCULAR | Status: AC
  Administered 2014-10-31: 250 mg via INTRAMUSCULAR
  Filled 2014-10-31: qty 250

## 2014-10-31 MED ORDER — LIDOCAINE HCL 2 % IJ SOLN
INTRAMUSCULAR | Status: AC
  Administered 2014-10-31: 45 mg
  Filled 2014-10-31: qty 20

## 2014-10-31 MED ORDER — AZITHROMYCIN 250 MG PO TABS
1000.0000 mg | ORAL_TABLET | Freq: Once | ORAL | Status: AC
  Administered 2014-10-31: 1000 mg via ORAL
  Filled 2014-10-31: qty 4

## 2014-10-31 NOTE — ED Notes (Signed)
Pt c/o STD. Reported that she was tested by PCP in [place] for STDs x1 week ago. Recently received results that stated she tested positive for Clamydia and was rx'd azithromycin but is unable to get the rx.

## 2014-10-31 NOTE — ED Provider Notes (Signed)
CSN: [id number]     Arrival date & time 10/31/14  1142 History   First MD Initiated Contact with Patient 10/31/14 1147     Chief Complaint  Patient presents with  . SEXUALLY TRANSMITTED DISEASE     (Consider location/radiation/quality/duration/timing/severity/associated sxs/prior Treatment) HPI Becky CrouchDanae Alexander is a 24 y.o. female who comes in for evaluation of STD treatment. Patient states she was diagnosed with chlamydia by her PCP and [place]. [facility] and that is not accepted down here and she is unable to fill her prescription for azithromycin. She reports today wanting to be treated for this problem. She does not wish to have a repeat exam done. She denies fevers, chills, nausea or vomiting, abdominal pain, pelvic pain, vaginal discharge or bleeding. She denies any other medical problems at this time. No other aggravating or modifying factors  Past Medical History  Diagnosis Date  . Genital herpes    Past Surgical History  Procedure Laterality Date  . Tonsillectomy  November 23, 2010   History reviewed. No pertinent family history. History  Substance Use Topics  . Smoking status: Never Smoker   . Smokeless tobacco: Not on file  . Alcohol Use: Yes     Comment: ocassionally   OB History    No data available     Review of Systems A 10 point review of systems was completed and was negative except for pertinent positives and negatives as mentioned in the history of present illness     Allergies  Review of patient's allergies indicates no known allergies.  Home Medications   Prior to Admission medications   Medication Sig Start Date End Date Taking? Authorizing Provider  Tetrahydrozoline HCl (VISINE OP) Apply 1-2 drops to eye daily as needed (redness).   Yes Historical Provider, MD  HYDROcodone-acetaminophen (NORCO/VICODIN) 5-325 MG per tablet Take 1 tablet by mouth every 4 (four) hours as needed for pain. Patient not taking: Reported  on 10/31/2014 12/13/12   Ruby Colaatherine Schinlever, PA-C   BP 115/74 mmHg  Pulse 85  Temp(Src) 98.6 F (37 C) (Oral)  Resp 16  Ht 5\' 3"  (1.6 m)  Wt 150 lb (68.04 kg)  BMI 26.58 kg/m2  SpO2 100%  LMP 10/05/2014 Physical Exam  Constitutional: She is oriented to person, place, and time. She appears well-developed and well-nourished.  HENT:  Head: Normocephalic and atraumatic.  Mouth/Throat: Oropharynx is clear and moist.  Eyes: Conjunctivae are normal. Pupils are equal, round, and reactive to light. Right eye exhibits no discharge. Left eye exhibits no discharge. No scleral icterus.  Neck: Neck supple.  Cardiovascular: Normal rate, regular rhythm and normal heart sounds.   Pulmonary/Chest: Effort normal and breath sounds normal. No respiratory distress. She has no wheezes. She has no rales.  Abdominal: Soft. There is no tenderness.  Musculoskeletal: She exhibits no tenderness.  Neurological: She is alert and oriented to person, place, and time.  Cranial Nerves II-XII grossly intact  Skin: Skin is warm and dry. No rash noted.  Psychiatric: She has a normal mood and affect.  Nursing note and vitals reviewed.   ED Course  Procedures (including critical care time) Labs Review Labs Reviewed - No data to display  Imaging Review No results found.   EKG Interpretation None     Meds given in ED:  Medications  azithromycin (ZITHROMAX) tablet 1,000 mg (1,000 mg Oral Given 10/31/14 1258)  cefTRIAXone (ROCEPHIN) injection 250 mg (250 mg Intramuscular Given 10/31/14 1259)  lidocaine (XYLOCAINE) 2 % (  with pres) injection (45 mg  Given 10/31/14 1259)    Discharge Medication List as of 10/31/2014 12:31 PM     Filed Vitals:   10/31/14 1153  BP: 115/74  Pulse: 85  Temp: 98.6 F (37 C)  TempSrc: Oral  Resp: 16  Height:  (1.6 m)  Weight: 150 lb (68.04 kg)  SpO2: 100%    MDM  Vitals stable - WNL -afebrile Pt resting comfortably in ED. PE--patient does not wish to have repeat exam  done. Physical exam is otherwise benign. Treated for GC chlamydia empirically in the ED. Patient will follow-up with her doctors as needed. I discussed all relevant lab findings and imaging results with pt and they verbalized understanding. Discussed f/u with PCP within 48 hrs and return precautions, pt very amenable to plan.  Final diagnoses:  Exposure to STD       Joycie Peek, PA-C 11/03/14 1431  Mirian Mo, MD 11/06/14 [phone]

## 2014-10-31 NOTE — Discharge Instructions (Signed)
Sexually Transmitted Disease °A sexually transmitted disease (STD) is a disease or infection that may be passed (transmitted) from person to person, usually during sexual activity. This may happen by way of saliva, semen, blood, vaginal mucus, or urine. Common STDs include:  °· Gonorrhea.   °· Chlamydia.   °· Syphilis.   °· HIV and AIDS.   °· Genital herpes.   °· Hepatitis B and C.   °· Trichomonas.   °· Human papillomavirus (HPV).   °· Pubic lice.   °· Scabies. °· Mites. °· Bacterial vaginosis. °WHAT ARE CAUSES OF STDs? °An STD may be caused by bacteria, a virus, or parasites. STDs are often transmitted during sexual activity if one person is infected. However, they may also be transmitted through nonsexual means. STDs may be transmitted after:  °· Sexual intercourse with an infected person.   °· Sharing sex toys with an infected person.   °· Sharing needles with an infected person or using unclean piercing or tattoo needles. °· Having intimate contact with the genitals, mouth, or rectal areas of an infected person.   °· Exposure to infected fluids during birth. °WHAT ARE THE SIGNS AND SYMPTOMS OF STDs? °Different STDs have different symptoms. Some people may not have any symptoms. If symptoms are present, they may include:  °· Painful or bloody urination.   °· Pain in the pelvis, abdomen, vagina, anus, throat, or eyes.   °· A skin rash, itching, or irritation. °· Growths, ulcerations, blisters, or sores in the genital and anal areas. °· Abnormal vaginal discharge with or without bad odor.   °· Penile discharge in men.   °· Fever.   °· Pain or bleeding during sexual intercourse.   °· Swollen glands in the groin area.   °· Yellow skin and eyes (jaundice). This is seen with hepatitis.   °· Swollen testicles. °· Infertility. °· Sores and blisters in the mouth. °HOW ARE STDs DIAGNOSED? °To make a diagnosis, your health care provider may:  °· Take a medical history.   °· Perform a physical exam.   °· Take a sample of  any discharge to examine. °· Swab the throat, cervix, opening to the penis, rectum, or vagina for testing. °· Test a sample of your first morning urine.   °· Perform blood tests.   °· Perform a Pap test, if this applies.   °· Perform a colposcopy.   °· Perform a laparoscopy.   °HOW ARE STDs TREATED? ° Treatment depends on the STD. Some STDs may be treated but not cured.  °· Chlamydia, gonorrhea, trichomonas, and syphilis can be cured with antibiotic medicine.   °· Genital herpes, hepatitis, and HIV can be treated, but not cured, with prescribed medicines. The medicines lessen symptoms.   °· Genital warts from HPV can be treated with medicine or by freezing, burning (electrocautery), or surgery. Warts may come back.   °· HPV cannot be cured with medicine or surgery. However, abnormal areas may be removed from the cervix, vagina, or vulva.   °· If your diagnosis is confirmed, your recent sexual partners need treatment. This is true even if they are symptom-free or have a negative culture or evaluation. They should not have sex until their health care providers say it is okay. °HOW CAN I REDUCE MY RISK OF GETTING AN STD? °Take these steps to reduce your risk of getting an STD: °· Use latex condoms, dental dams, and water-soluble lubricants during sexual activity. Do not use petroleum jelly or oils. °· Avoid having multiple sex partners. °· Do not have sex with someone who has other sex partners. °· Do not have sex with anyone you do not know or who is at   high risk for an STD. °· Avoid risky sex practices that can break your skin. °· Do not have sex if you have open sores on your mouth or skin. °· Avoid drinking too much alcohol or taking illegal drugs. Alcohol and drugs can affect your judgment and put you in a vulnerable position. °· Avoid engaging in oral and anal sex acts. °· Get vaccinated for HPV and hepatitis. If you have not received these vaccines in the past, talk to your health care provider about whether one  or both might be right for you.   °· If you are at risk of being infected with HIV, it is recommended that you take a prescription medicine daily to prevent HIV infection. This is called pre-exposure prophylaxis (PrEP). You are considered at risk if: °¨ You are a man who has sex with other men (MSM). °¨ You are a heterosexual man or woman and are sexually active with more than one partner. °¨ You take drugs by injection. °¨ You are sexually active with a partner who has HIV. °· Talk with your health care provider about whether you are at high risk of being infected with HIV. If you choose to begin PrEP, you should first be tested for HIV. You should then be tested every 3 months for as long as you are taking PrEP.   °WHAT SHOULD I DO IF I THINK I HAVE AN STD? °· See your health care provider.   °· Tell your sexual partner(s). They should be tested and treated for any STDs. °· Do not have sex until your health care provider says it is okay.  °WHEN SHOULD I GET IMMEDIATE MEDICAL CARE? °Contact your health care provider right away if:  °· You have severe abdominal pain. °· You are a man and notice swelling or pain in your testicles. °· You are a woman and notice swelling or pain in your vagina. °Document Released: 07/01/2002 Document Revised: 04/15/2013 Document Reviewed: 10/29/2012 °[employer]® Patient Information ©2015 [employer]. This information is not intended to replace advice given to you by your health care provider. Make sure you discuss any questions you have with your health care provider. ° °Safe Sex °Safe sex is about reducing the risk of giving or getting a sexually transmitted disease (STD). STDs are spread through sexual contact involving the genitals, mouth, or rectum. Some STDs can be cured and others cannot. Safe sex can also prevent unintended pregnancies.  °WHAT ARE SOME SAFE SEX PRACTICES? °· Limit your sexual activity to only one partner who is having sex with only you. °· Talk to your partner  about his or her past partners, past STDs, and drug use. °· Use a condom every time you have sexual intercourse. This includes vaginal, oral, and anal sexual activity. Both females and males should wear condoms during oral sex. Only use latex or polyurethane condoms and water-based lubricants. Using petroleum-based lubricants or oils to lubricate a condom will weaken the condom and increase the chance that it will break. The condom should be in place from the beginning to the end of sexual activity. Wearing a condom reduces, but does not completely eliminate, your risk of getting or giving an STD. STDs can be spread by contact with infected body fluids and skin. °· Get vaccinated for hepatitis B and HPV. °· Avoid alcohol and recreational drugs, which can affect your judgment. You may forget to use a condom or participate in high-risk sex. °· For females, avoid douching after sexual intercourse. Douching can spread an infection   farther into the reproductive tract. °· Check your body for signs of sores, blisters, rashes, or unusual discharge. See your health care provider if you notice any of these signs. °· Avoid sexual contact if you have symptoms of an infection or are being treated for an STD. If you or your partner has herpes, avoid sexual contact when blisters are present. Use condoms at all other times. °· If you are at risk of being infected with HIV, it is recommended that you take a prescription medicine daily to prevent HIV infection. This is called pre-exposure prophylaxis (PrEP). You are considered at risk if: °¨ You are a man who has sex with other men (MSM). °¨ You are a heterosexual man or woman who is sexually active with more than one partner. °¨ You take drugs by injection. °¨ You are sexually active with a partner who has HIV. °· Talk with your health care provider about whether you are at high risk of being infected with HIV. If you choose to begin PrEP, you should first be tested for HIV. You  should then be tested every 3 months for as long as you are taking PrEP. °· See your health care provider for regular screenings, exams, and tests for other STDs. Before having sex with a new partner, each of you should be screened for STDs and should talk about the results with each other. °WHAT ARE THE BENEFITS OF SAFE SEX?  °· There is less chance of getting or giving an STD. °· You can prevent unwanted or unintended pregnancies. °· By discussing safe sex concerns with your partner, you may increase feelings of intimacy, comfort, trust, and honesty between the two of you. °Document Released: 05/18/2004 Document Revised: 08/25/2013 Document Reviewed: 10/02/2011 °[employer]® Patient Information ©2015 [employer]. This information is not intended to replace advice given to you by your health care provider. Make sure you discuss any questions you have with your health care provider. ° °
